# Patient Record
Sex: Female | Born: 1980 | Hispanic: No | Marital: Single | Smoking: Never smoker
Health system: Southern US, Community
[De-identification: ages and names within clinical notes are randomized; demographics above are authoritative.]

## PROBLEM LIST (undated history)

## (undated) DIAGNOSIS — I10 Essential (primary) hypertension: Secondary | ICD-10-CM

## (undated) DIAGNOSIS — G43909 Migraine, unspecified, not intractable, without status migrainosus: Secondary | ICD-10-CM

## (undated) HISTORY — DX: Migraine, unspecified, not intractable, without status migrainosus: G43.909

---

## 2013-04-08 ENCOUNTER — Emergency Department (HOSPITAL_BASED_OUTPATIENT_CLINIC_OR_DEPARTMENT_OTHER): Payer: Self-pay

## 2013-04-08 ENCOUNTER — Encounter (HOSPITAL_BASED_OUTPATIENT_CLINIC_OR_DEPARTMENT_OTHER): Payer: Self-pay

## 2013-04-08 ENCOUNTER — Emergency Department (HOSPITAL_BASED_OUTPATIENT_CLINIC_OR_DEPARTMENT_OTHER)
Admission: EM | Admit: 2013-04-08 | Discharge: 2013-04-08 | Disposition: A | Discharge status: Home / Self Care | Payer: Self-pay | Attending: Emergency Medicine | Admitting: Emergency Medicine

## 2013-04-08 DIAGNOSIS — R42 Dizziness and giddiness: Secondary | ICD-10-CM | POA: Insufficient documentation

## 2013-04-08 DIAGNOSIS — I10 Essential (primary) hypertension: Secondary | ICD-10-CM | POA: Insufficient documentation

## 2013-04-08 DIAGNOSIS — S0990XA Unspecified injury of head, initial encounter: Secondary | ICD-10-CM | POA: Insufficient documentation

## 2013-04-08 DIAGNOSIS — S6990XA Unspecified injury of unspecified wrist, hand and finger(s), initial encounter: Secondary | ICD-10-CM | POA: Insufficient documentation

## 2013-04-08 DIAGNOSIS — M79642 Pain in left hand: Secondary | ICD-10-CM

## 2013-04-08 DIAGNOSIS — S20219A Contusion of unspecified front wall of thorax, initial encounter: Secondary | ICD-10-CM | POA: Insufficient documentation

## 2013-04-08 HISTORY — DX: Essential (primary) hypertension: I10

## 2013-04-08 MED ORDER — HYDROCODONE-ACETAMINOPHEN 5-325 MG PO TABS: 1.0000 | ORAL_TABLET | Freq: Four times a day (QID) | ORAL | Status: DC | PRN
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
## 2013-04-08 NOTE — ED Notes (Signed)
Pt is unsure of plan of care after d/c.  [facility] [employer] of pt condition and pt is now speaking with staff regarding a plan of care.

## 2013-04-08 NOTE — ED Notes (Signed)
Pt states she is awaiting return call from [facility] regarding assistance.

## 2013-04-08 NOTE — ED Notes (Signed)
Communications informed that pt has been d/c'd home.

## 2013-04-08 NOTE — ED Notes (Signed)
[employer] called for pt transport.  Voucher provided.
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
## 2013-04-08 NOTE — ED Notes (Signed)
Spoke with [employer] regarding transport to patient's home.  [employer] unable to transport pt and baby.  Plan to call cab for transport to patient's home and [employer] to meet pt.

## 2013-09-28 ENCOUNTER — Ambulatory Visit: Payer: 59 | Attending: Physical Medicine & Rehabilitation | Admitting: Physical Therapy

## 2013-09-28 DIAGNOSIS — IMO0001 Reserved for inherently not codable concepts without codable children: Secondary | ICD-10-CM | POA: Diagnosis not present

## 2013-09-28 DIAGNOSIS — R609 Edema, unspecified: Secondary | ICD-10-CM | POA: Insufficient documentation

## 2013-09-28 DIAGNOSIS — M25569 Pain in unspecified knee: Secondary | ICD-10-CM | POA: Insufficient documentation

## 2013-09-28 DIAGNOSIS — M6281 Muscle weakness (generalized): Secondary | ICD-10-CM | POA: Diagnosis not present

## 2013-10-05 ENCOUNTER — Encounter: Payer: PRIVATE HEALTH INSURANCE | Admitting: Physical Therapy

## 2013-10-05 ENCOUNTER — Ambulatory Visit: Payer: 59 | Attending: Emergency Medicine | Admitting: Physical Therapy

## 2013-10-05 DIAGNOSIS — IMO0001 Reserved for inherently not codable concepts without codable children: Secondary | ICD-10-CM | POA: Diagnosis not present

## 2013-10-05 DIAGNOSIS — M25569 Pain in unspecified knee: Secondary | ICD-10-CM | POA: Diagnosis not present

## 2013-10-05 DIAGNOSIS — R609 Edema, unspecified: Secondary | ICD-10-CM | POA: Diagnosis not present

## 2013-10-05 DIAGNOSIS — M6281 Muscle weakness (generalized): Secondary | ICD-10-CM | POA: Insufficient documentation

## 2013-10-12 ENCOUNTER — Ambulatory Visit: Payer: 59 | Admitting: Physical Therapy

## 2013-10-12 DIAGNOSIS — IMO0001 Reserved for inherently not codable concepts without codable children: Secondary | ICD-10-CM | POA: Diagnosis not present

## 2013-10-19 ENCOUNTER — Ambulatory Visit: Payer: 59 | Admitting: Physical Therapy

## 2013-10-19 DIAGNOSIS — IMO0001 Reserved for inherently not codable concepts without codable children: Secondary | ICD-10-CM | POA: Diagnosis not present

## 2013-10-26 ENCOUNTER — Ambulatory Visit: Payer: 59 | Admitting: Physical Therapy

## 2013-10-26 DIAGNOSIS — IMO0001 Reserved for inherently not codable concepts without codable children: Secondary | ICD-10-CM | POA: Diagnosis not present

## 2013-11-02 ENCOUNTER — Ambulatory Visit: Payer: PRIVATE HEALTH INSURANCE | Attending: Orthopedic Surgery | Admitting: Physical Therapy

## 2013-11-02 ENCOUNTER — Encounter: Payer: PRIVATE HEALTH INSURANCE | Admitting: Physical Therapy

## 2013-11-02 DIAGNOSIS — R609 Edema, unspecified: Secondary | ICD-10-CM | POA: Insufficient documentation

## 2013-11-02 DIAGNOSIS — IMO0001 Reserved for inherently not codable concepts without codable children: Secondary | ICD-10-CM | POA: Insufficient documentation

## 2013-11-02 DIAGNOSIS — M25569 Pain in unspecified knee: Secondary | ICD-10-CM | POA: Insufficient documentation

## 2013-11-02 DIAGNOSIS — M6281 Muscle weakness (generalized): Secondary | ICD-10-CM | POA: Insufficient documentation

## 2013-11-09 ENCOUNTER — Ambulatory Visit: Payer: PRIVATE HEALTH INSURANCE | Admitting: Physical Therapy

## 2013-11-16 ENCOUNTER — Encounter: Payer: PRIVATE HEALTH INSURANCE | Admitting: Physical Therapy

## 2013-11-23 ENCOUNTER — Encounter: Payer: PRIVATE HEALTH INSURANCE | Admitting: Physical Therapy

## 2013-12-07 ENCOUNTER — Ambulatory Visit: Payer: PRIVATE HEALTH INSURANCE | Attending: Emergency Medicine | Admitting: Physical Therapy

## 2013-12-07 DIAGNOSIS — M6281 Muscle weakness (generalized): Secondary | ICD-10-CM | POA: Insufficient documentation

## 2013-12-07 DIAGNOSIS — R609 Edema, unspecified: Secondary | ICD-10-CM | POA: Insufficient documentation

## 2013-12-07 DIAGNOSIS — M25569 Pain in unspecified knee: Secondary | ICD-10-CM | POA: Insufficient documentation

## 2013-12-07 DIAGNOSIS — IMO0001 Reserved for inherently not codable concepts without codable children: Secondary | ICD-10-CM | POA: Insufficient documentation

## 2013-12-14 ENCOUNTER — Ambulatory Visit: Payer: PRIVATE HEALTH INSURANCE | Admitting: Physical Therapy

## 2013-12-21 ENCOUNTER — Ambulatory Visit: Payer: PRIVATE HEALTH INSURANCE | Admitting: Physical Therapy

## 2013-12-28 ENCOUNTER — Ambulatory Visit: Payer: PRIVATE HEALTH INSURANCE | Attending: Orthopedic Surgery | Admitting: Physical Therapy

## 2013-12-28 DIAGNOSIS — R609 Edema, unspecified: Secondary | ICD-10-CM | POA: Insufficient documentation

## 2013-12-28 DIAGNOSIS — M25569 Pain in unspecified knee: Secondary | ICD-10-CM | POA: Insufficient documentation

## 2013-12-28 DIAGNOSIS — IMO0001 Reserved for inherently not codable concepts without codable children: Secondary | ICD-10-CM | POA: Insufficient documentation

## 2013-12-28 DIAGNOSIS — M6281 Muscle weakness (generalized): Secondary | ICD-10-CM | POA: Insufficient documentation
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
## 2014-01-02 ENCOUNTER — Ambulatory Visit: Payer: PRIVATE HEALTH INSURANCE | Admitting: Physical Therapy

## 2014-01-04 ENCOUNTER — Encounter: Payer: PRIVATE HEALTH INSURANCE | Admitting: Physical Therapy

## 2014-01-09 ENCOUNTER — Encounter: Payer: 59 | Admitting: Physical Therapy

## 2014-01-09 ENCOUNTER — Encounter: Payer: PRIVATE HEALTH INSURANCE | Admitting: Physical Therapy

## 2014-01-16 ENCOUNTER — Encounter: Payer: PRIVATE HEALTH INSURANCE | Admitting: Physical Therapy

## 2014-01-18 ENCOUNTER — Encounter: Payer: Self-pay | Admitting: Emergency Medicine

## 2014-01-18 ENCOUNTER — Ambulatory Visit (INDEPENDENT_AMBULATORY_CARE_PROVIDER_SITE_OTHER): Payer: PRIVATE HEALTH INSURANCE | Admitting: Emergency Medicine

## 2014-01-18 VITALS — BP 124/84 | Ht 64.0 in | Wt 192.0 lb

## 2014-01-18 DIAGNOSIS — M2140 Flat foot [pes planus] (acquired), unspecified foot: Secondary | ICD-10-CM

## 2014-01-18 DIAGNOSIS — M216X2 Other acquired deformities of left foot: Principal | ICD-10-CM

## 2014-01-18 DIAGNOSIS — M216X9 Other acquired deformities of unspecified foot: Secondary | ICD-10-CM

## 2014-01-18 DIAGNOSIS — M216X1 Other acquired deformities of right foot: Secondary | ICD-10-CM

## 2014-01-18 NOTE — Assessment & Plan Note (Signed)
Green soles insoles with scaphoid pads were applied today to her shoes. I also recommended that she consider a more supportive sandal shoe or sandal is a fair amount of time. She will wear these temporary insoles and if they seem to alleviate her symptoms and help with both her knee and lateral hip pain she will consider returning for custom orthotics.

## 2014-01-18 NOTE — Progress Notes (Signed)
Patient ID: Cynthia LabsSameena Carey, female   DOB: 1981/05/09, 33 y.o.   MRN: [id number] 33 year old female presents for evaluation of need for arch supports. She is currently going to physical therapy for knee pain as well as left hip pain. She's been noted to have a significant amount of arch collapse which seems to exacerbate her symptoms when she is walking for extended periods.  Pertinent past medical history: Left hip/IT band syndrome, left knee pain/patellofemoral syndrome.  Social history: Nonsmoker, no alcohol  Review of systems as per history of present illness otherwise all systems negative present.  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

## 2014-01-18 NOTE — Patient Instructions (Signed)
There are numerous sandals available that have good supportive bases.  Companies like [employer] make sandals that will provide good support for your feet when you wear sandals.  If you feel comfortable in the inserts we made today and they help you may consider custom orthotics that will mold to your feet and provide excellent long term support.

## 2014-01-23 ENCOUNTER — Encounter: Payer: PRIVATE HEALTH INSURANCE | Admitting: Physical Therapy

## 2014-02-13 ENCOUNTER — Encounter: Payer: Self-pay | Admitting: Cardiology

## 2014-02-13 ENCOUNTER — Ambulatory Visit (INDEPENDENT_AMBULATORY_CARE_PROVIDER_SITE_OTHER): Payer: PRIVATE HEALTH INSURANCE | Admitting: Cardiology

## 2014-02-13 VITALS — BP 132/76 | HR 103 | Ht 64.0 in | Wt 195.0 lb

## 2014-02-13 DIAGNOSIS — G43909 Migraine, unspecified, not intractable, without status migrainosus: Secondary | ICD-10-CM | POA: Insufficient documentation

## 2014-02-13 DIAGNOSIS — I10 Essential (primary) hypertension: Secondary | ICD-10-CM

## 2014-02-13 DIAGNOSIS — R002 Palpitations: Secondary | ICD-10-CM

## 2014-02-13 DIAGNOSIS — R0789 Other chest pain: Secondary | ICD-10-CM
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
## 2014-02-13 NOTE — Patient Instructions (Signed)
Your physician recommends that you continue on your current medications as directed. Please refer to the Current Medication list given to you today.  Your physician has requested that you have an exercise tolerance test. For further information please visit [email or web address]. Please also follow instruction sheet, as given.  Your physician has recommended that you wear a holter monitor. Holter monitors are medical devices that record the heart's electrical activity. Doctors most often use these monitors to diagnose arrhythmias. Arrhythmias are problems with the speed or rhythm of the heartbeat. The monitor is a small, portable device. You can wear one while you do your normal daily activities. This is usually used to diagnose what is causing palpitations/syncope (passing out).   Your physician has requested that you regularly monitor and record your blood pressure readings at home. Please use the same machine at the same time of day to check your readings and record them for one week and call us with the results.  Your physician recommends that you schedule a follow-up appointment As Needed with Dr Mayford Knifeurner

## 2014-03-01 ENCOUNTER — Ambulatory Visit: Payer: PRIVATE HEALTH INSURANCE | Admitting: Emergency Medicine

## 2014-03-02 ENCOUNTER — Ambulatory Visit: Payer: PRIVATE HEALTH INSURANCE | Admitting: Internal Medicine

## 2014-03-16 ENCOUNTER — Encounter (HOSPITAL_COMMUNITY): Payer: Self-pay | Admitting: *Deleted

## 2014-03-23 ENCOUNTER — Encounter (HOSPITAL_COMMUNITY): Payer: PRIVATE HEALTH INSURANCE

## 2014-04-14 ENCOUNTER — Telehealth (HOSPITAL_COMMUNITY): Payer: Self-pay

## 2014-04-14 NOTE — Telephone Encounter (Signed)
Encounter complete. 

## 2014-04-19 ENCOUNTER — Encounter (HOSPITAL_COMMUNITY): Payer: PRIVATE HEALTH INSURANCE

## 2014-04-20 ENCOUNTER — Telehealth (HOSPITAL_COMMUNITY): Payer: Self-pay

## 2014-04-20 NOTE — Telephone Encounter (Signed)
Encounter complete. 

## 2014-04-25 ENCOUNTER — Ambulatory Visit (HOSPITAL_COMMUNITY)
Admission: RE | Admit: 2014-04-25 | Discharge: 2014-04-25 | Disposition: A | Discharge status: Home / Self Care | Payer: 59 | Source: Ambulatory Visit | Attending: Cardiovascular Disease | Admitting: Cardiovascular Disease

## 2014-04-25 DIAGNOSIS — I498 Other specified cardiac arrhythmias: Secondary | ICD-10-CM | POA: Diagnosis present

## 2014-04-25 DIAGNOSIS — R0789 Other chest pain: Secondary | ICD-10-CM

## 2014-04-25 NOTE — Procedures (Signed)
Exercise Treadmill Test  Pre-Exercise Testing Evaluation Mild sinus tachycardia, 104 bpm  Test  Exercise Tolerance Test Ordering MD: Armanda Magicraci Turner    Unique Test No: 1   Treadmill:  1  Indication for ETT: palpitations  Contraindication to ETT: No   Stress Modality: exercise - treadmill  Cardiac Imaging Performed: non   Protocol: standard Bruce - maximal  Max BP:  149/86  Max MPHR (bpm):  186 85% MPR (bpm):  158  MPHR obtained (bpm):  187 % MPHR obtained:  100  Reached 85% MPHR (min:sec):  4:10 Total Exercise Time (min-sec):  7  Workload in METS:  8.5 Borg Scale: 16  Reason ETT Terminated:  dyspnea    ST Segment Analysis At Rest: normal ST segments - no evidence of significant ST depression With Exercise: no evidence of significant ST depression  Other Information Arrhythmia:  No Angina during ETT:  absent (0) Quality of ETT:  diagnostic  ETT Interpretation:  normal - no evidence of ischemia by ST analysis  Comments: Baseline sinus tachycardia  Thurmon FairMihai Advaith Lamarque, MD, [facility] [phone] office [phone] pager

## 2014-04-27 ENCOUNTER — Encounter (INDEPENDENT_AMBULATORY_CARE_PROVIDER_SITE_OTHER): Payer: PRIVATE HEALTH INSURANCE

## 2014-04-27 ENCOUNTER — Encounter: Payer: Self-pay | Admitting: *Deleted

## 2014-04-27 DIAGNOSIS — R002 Palpitations: Secondary | ICD-10-CM

## 2014-04-27 NOTE — Progress Notes (Signed)
Patient ID: Cynthia Carey, female   DOB: 1981/02/22, 33 y.o.   MRN: [id number] E-Cardio 24 hour holter monitor applied to patient.

## 2014-04-28 ENCOUNTER — Ambulatory Visit (INDEPENDENT_AMBULATORY_CARE_PROVIDER_SITE_OTHER): Payer: PRIVATE HEALTH INSURANCE | Admitting: Sports Medicine

## 2014-04-28 ENCOUNTER — Encounter: Payer: Self-pay | Admitting: Sports Medicine

## 2014-04-28 VITALS — BP 112/75 | HR 102 | Ht 64.0 in | Wt 195.0 lb

## 2014-04-28 DIAGNOSIS — M216X9 Other acquired deformities of unspecified foot: Secondary | ICD-10-CM

## 2014-04-28 DIAGNOSIS — M216X2 Other acquired deformities of left foot: Principal | ICD-10-CM

## 2014-04-28 DIAGNOSIS — M216X1 Other acquired deformities of right foot: Secondary | ICD-10-CM

## 2014-04-28 NOTE — Progress Notes (Signed)
Patient ID: Cynthia Carey, female   DOB: 29-Mar-1981, 33 y.o.   MRN: [id number]  Patient comes in today to consider custom orthotics. She was recently fitted with a pair of green sports insoles and scaphoid pads. She has found them to be comfortable. She would like to now go ahead and schedule an appointment for custom orthotics to be made. Unfortunately, due to time constraints, we were unable to construct those today. We will schedule her to return to the office on 05/02/2014 at 2:30.

## 2014-05-02 ENCOUNTER — Ambulatory Visit: Payer: PRIVATE HEALTH INSURANCE | Admitting: Sports Medicine

## 2014-05-08 ENCOUNTER — Telehealth: Payer: Self-pay | Admitting: Cardiology

## 2014-05-08 NOTE — Telephone Encounter (Signed)
Please let patient know that heart monitor showed NSR with sinus tachycardia up to 161 bpm.  Her average HR is 108bpm.  Please start Atenolol 12.5mg  daily and have her followup with nurse BP/HR check in 1 week.  Please have her check a TSH

## 2014-05-09 ENCOUNTER — Encounter: Payer: Self-pay | Admitting: Sports Medicine

## 2014-05-09 ENCOUNTER — Ambulatory Visit (INDEPENDENT_AMBULATORY_CARE_PROVIDER_SITE_OTHER): Payer: PRIVATE HEALTH INSURANCE | Admitting: Sports Medicine

## 2014-05-09 VITALS — BP 127/86 | HR 102 | Ht 64.0 in | Wt 195.0 lb

## 2014-05-09 DIAGNOSIS — M216X2 Other acquired deformities of left foot: Principal | ICD-10-CM

## 2014-05-09 DIAGNOSIS — M216X9 Other acquired deformities of unspecified foot: Secondary | ICD-10-CM

## 2014-05-09 DIAGNOSIS — M216X1 Other acquired deformities of right foot: Secondary | ICD-10-CM

## 2014-05-09 NOTE — Progress Notes (Signed)
Subjective: The patient presents today for custom orthotics for bilateral pes planus.  Please see the prior notes for history pertinent to this problem.   Objective: Examination of the bilateral feet reveals pes planus deformity.  Assessment: Bilateral pes planus   Procedure: Patient was fitted for a : standard, cushioned, semi-rigid orthotic.  The orthotic was heated and afterward the patient stood on the orthotic blank positioned on the orthotic stand.  The patient was positioned in subtalar neutral position and 10 degrees of ankle dorsiflexion in a weight bearing stance.  After completion of molding, a stable base was applied to the orthotic blank.  The blank was ground to a stable position for weight bearing.  Size: 7 Base: Blue EVA  Posting: None  Additional orthotic padding: None   Plan: Patient may return to clinic if she has issues with the orthotics. We discussed that she may take 1-2 days to adjust to the orthotics, but if she has any significant increase in pain, then she should call for orthotic adjustment. All questions were answered.   Time spent with visit 30 minutes, greater than 50% was face-to-face time counseling, constructing, and fitting the orthotics.

## 2014-05-09 NOTE — Assessment & Plan Note (Signed)
Patient may return to clinic if she has issues with the orthotics. We discussed that she may take 1-2 days to adjust to the orthotics, but if she has any significant increase in pain, then she should call for orthotic adjustment. All questions were answered.  Time spent with visit 30 minutes, greater than 50% was face-to-face time counseling, constructing, and fitting the orthotics.

## 2014-05-10 ENCOUNTER — Other Ambulatory Visit: Payer: Self-pay | Admitting: General Surgery

## 2014-05-10 ENCOUNTER — Telehealth: Payer: Self-pay | Admitting: Cardiology

## 2014-05-10 DIAGNOSIS — R000 Tachycardia, unspecified: Secondary | ICD-10-CM

## 2014-05-10 MED ORDER — ATENOLOL 25 MG PO TABS: 12.5000 mg | ORAL_TABLET | Freq: Every day | ORAL | Status: DC

## 2014-05-10 NOTE — Telephone Encounter (Signed)
lmtrc

## 2014-05-10 NOTE — Telephone Encounter (Signed)
Spoke with pt and New rx sent in for pt.

## 2014-05-10 NOTE — Telephone Encounter (Signed)
New message     Pt is returning a nurses call from a few days ago

## 2014-05-10 NOTE — Telephone Encounter (Signed)
Spoke with pt and made aware New rx sent in for pt. Will have pt come in on my day of F/u next Friday Aug 21st for BP check since nurse rooms are closed and Dr Mayford Knifeurner wants this done. Will ask linda to put on schedule and lab schedule.

## 2014-05-10 NOTE — Telephone Encounter (Signed)
Spoke with pt and gave appt time

## 2014-05-10 NOTE — Telephone Encounter (Signed)
New message ° ° ° ° ° ° ° ° ° °Pt returning nurses call °

## 2014-05-19 ENCOUNTER — Other Ambulatory Visit: Payer: PRIVATE HEALTH INSURANCE

## 2014-06-09 ENCOUNTER — Telehealth: Payer: Self-pay | Admitting: Cardiology

## 2014-06-09 NOTE — Telephone Encounter (Signed)
TO Dr Turner to advise.  

## 2014-06-09 NOTE — Telephone Encounter (Signed)
Please find out if she has ever been on Bystolic

## 2014-06-09 NOTE — Telephone Encounter (Signed)
New Message  Pt called states that she is experiencing symptoms with Atenolol. Pt states that she is feeling drowsy and her throat is dry and often feels as if she is choking.

## 2014-06-12 NOTE — Telephone Encounter (Signed)
Pt has appt to see you tomorrow at 10:45 to discuss more.

## 2014-06-12 NOTE — Telephone Encounter (Signed)
Pt had been on Bystolic in the past and it caused the same problems as the atenolol. She stopped atenolol after only two days. Her problems stopped after taking the medication. Pt felt her BP was to low on atenolol example 110/70 and her HR was still 100-105. Pt does not feel any issues with her Tachycardia. She states that her HR is mostly still over 100. Pt does not want to continue with a beta blocker.

## 2014-06-13 ENCOUNTER — Encounter: Payer: Self-pay | Admitting: General Surgery

## 2014-06-13 ENCOUNTER — Encounter: Payer: Self-pay | Admitting: Cardiology

## 2014-06-13 ENCOUNTER — Ambulatory Visit (INDEPENDENT_AMBULATORY_CARE_PROVIDER_SITE_OTHER): Payer: 59 | Admitting: Cardiology

## 2014-06-13 ENCOUNTER — Other Ambulatory Visit (INDEPENDENT_AMBULATORY_CARE_PROVIDER_SITE_OTHER): Payer: 59

## 2014-06-13 VITALS — BP 110/77 | HR 93 | Ht 64.0 in | Wt 199.0 lb

## 2014-06-13 DIAGNOSIS — R000 Tachycardia, unspecified: Secondary | ICD-10-CM

## 2014-06-13 DIAGNOSIS — I498 Other specified cardiac arrhythmias: Secondary | ICD-10-CM

## 2014-06-13 DIAGNOSIS — I10 Essential (primary) hypertension: Secondary | ICD-10-CM

## 2014-06-13 DIAGNOSIS — R002 Palpitations: Secondary | ICD-10-CM

## 2014-06-13 LAB — TSH: TSH: 1.72 u[IU]/mL (ref 0.35–4.50)
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
## 2014-06-13 NOTE — Patient Instructions (Signed)
Your physician recommends that you schedule a follow-up appointment in: 4 months with Dr Mayford Knife

## 2014-06-14 ENCOUNTER — Other Ambulatory Visit: Payer: PRIVATE HEALTH INSURANCE

## 2014-06-20 ENCOUNTER — Ambulatory Visit: Payer: No Typology Code available for payment source | Attending: Orthopedic Surgery | Admitting: Occupational Therapy

## 2014-06-20 DIAGNOSIS — M2550 Pain in unspecified joint: Secondary | ICD-10-CM | POA: Diagnosis not present

## 2014-06-20 DIAGNOSIS — IMO0001 Reserved for inherently not codable concepts without codable children: Secondary | ICD-10-CM | POA: Insufficient documentation

## 2014-06-20 DIAGNOSIS — M6281 Muscle weakness (generalized): Secondary | ICD-10-CM | POA: Insufficient documentation

## 2014-06-22 ENCOUNTER — Encounter: Payer: 59 | Admitting: Occupational Therapy

## 2014-06-23 ENCOUNTER — Ambulatory Visit: Payer: No Typology Code available for payment source | Admitting: Occupational Therapy

## 2014-06-23 DIAGNOSIS — IMO0001 Reserved for inherently not codable concepts without codable children: Secondary | ICD-10-CM | POA: Diagnosis not present

## 2014-06-27 ENCOUNTER — Ambulatory Visit: Payer: No Typology Code available for payment source | Admitting: Occupational Therapy

## 2014-06-27 DIAGNOSIS — IMO0001 Reserved for inherently not codable concepts without codable children: Secondary | ICD-10-CM | POA: Diagnosis not present

## 2014-06-29 ENCOUNTER — Ambulatory Visit: Payer: No Typology Code available for payment source | Attending: Orthopedic Surgery | Admitting: Occupational Therapy

## 2014-06-29 DIAGNOSIS — M2550 Pain in unspecified joint: Secondary | ICD-10-CM | POA: Insufficient documentation

## 2014-06-29 DIAGNOSIS — M654 Radial styloid tenosynovitis [de Quervain]: Secondary | ICD-10-CM | POA: Insufficient documentation

## 2014-06-29 DIAGNOSIS — M6281 Muscle weakness (generalized): Secondary | ICD-10-CM | POA: Insufficient documentation

## 2014-06-29 DIAGNOSIS — M25531 Pain in right wrist: Secondary | ICD-10-CM | POA: Diagnosis present

## 2014-06-30 ENCOUNTER — Encounter: Payer: 59 | Admitting: Occupational Therapy

## 2014-07-05 ENCOUNTER — Encounter: Payer: 59 | Admitting: Occupational Therapy

## 2014-07-07 ENCOUNTER — Ambulatory Visit: Payer: No Typology Code available for payment source | Admitting: Occupational Therapy

## 2014-07-07 DIAGNOSIS — M25531 Pain in right wrist: Secondary | ICD-10-CM | POA: Diagnosis not present

## 2014-07-11 ENCOUNTER — Ambulatory Visit: Payer: No Typology Code available for payment source | Admitting: Occupational Therapy

## 2014-07-11 DIAGNOSIS — M25531 Pain in right wrist: Secondary | ICD-10-CM | POA: Diagnosis not present

## 2014-07-13 ENCOUNTER — Encounter: Payer: 59 | Admitting: Occupational Therapy

## 2014-07-14 ENCOUNTER — Ambulatory Visit: Payer: No Typology Code available for payment source | Admitting: Occupational Therapy

## 2014-07-14 DIAGNOSIS — M25531 Pain in right wrist: Secondary | ICD-10-CM | POA: Diagnosis not present

## 2014-07-19 ENCOUNTER — Ambulatory Visit: Payer: No Typology Code available for payment source | Admitting: Occupational Therapy

## 2014-07-19 DIAGNOSIS — M25531 Pain in right wrist: Secondary | ICD-10-CM | POA: Diagnosis not present

## 2014-07-21 ENCOUNTER — Ambulatory Visit: Payer: No Typology Code available for payment source | Admitting: Occupational Therapy

## 2014-07-21 DIAGNOSIS — M25531 Pain in right wrist: Secondary | ICD-10-CM | POA: Diagnosis not present

## 2014-07-25 ENCOUNTER — Ambulatory Visit: Payer: No Typology Code available for payment source | Admitting: Occupational Therapy

## 2014-07-25 DIAGNOSIS — M25531 Pain in right wrist: Secondary | ICD-10-CM | POA: Diagnosis not present

## 2014-07-27 ENCOUNTER — Ambulatory Visit: Payer: No Typology Code available for payment source | Admitting: Occupational Therapy

## 2014-07-31 ENCOUNTER — Ambulatory Visit: Payer: No Typology Code available for payment source | Attending: Orthopedic Surgery | Admitting: Occupational Therapy

## 2014-07-31 ENCOUNTER — Encounter: Payer: Self-pay | Admitting: Occupational Therapy

## 2014-07-31 DIAGNOSIS — M25531 Pain in right wrist: Secondary | ICD-10-CM | POA: Diagnosis not present

## 2014-07-31 DIAGNOSIS — M6281 Muscle weakness (generalized): Secondary | ICD-10-CM | POA: Insufficient documentation

## 2014-07-31 DIAGNOSIS — M2550 Pain in unspecified joint: Secondary | ICD-10-CM | POA: Insufficient documentation

## 2014-07-31 DIAGNOSIS — M654 Radial styloid tenosynovitis [de Quervain]: Secondary | ICD-10-CM | POA: Diagnosis not present

## 2014-07-31 NOTE — Therapy (Addendum)
Occupational Therapy Treatment  Patient Details  Name: Cynthia FeltSameena Gent MRN: [id number] Date of Birth: [birth year]  Encounter Date: 07/31/2014      OT End of Session - 07/31/14 1615    Visit Number 11   Number of Visits 17   Date for OT Re-Evaluation 08/18/14   OT Start Time 0248   OT Stop Time 0345   OT Time Calculation (min) 57 min   Activity Tolerance Patient tolerated treatment well    *OT treatment provided from 1448-1545 on 07/31/14.  Past Medical History  Diagnosis Date  . Hypertension     during pregnancy  . Migraine     History reviewed. No pertinent past surgical history.  There were no vitals taken for this visit.  Visit Diagnosis:  Right wrist pain  Hand muscle weakness   Subjective:  Pt reports pain improved overall, but lifted suitcases since last visit and pain is worse since lifting it.  Pt reports that she hasn't done HEP last few days due to 6/10 pain.  Pt reports that she saw MD 07/25/14 and that he recommended strengthening.  Pain:  Pt reports 5/10 pain in R hand with rest alleviating and lifting aggravating.       OT Treatments/Exercises (OP) - 07/31/14 0700    Ultrasound Location R dorsal/ulnar wrist   Ultrasound Parameters 3.23mhz, 20% pulsed, 0.8wts/cm2, x768min   Ultrasound Goals Pain  no adverse reactions   Type of Iontophoresis Dexamethasone   Location R volar/radial wrist   Dose 1.5cc, intensity=2.1MA   Time 19min with no adverse reactions, 3 small blisters and mild redness noted at medication electrode site     Attempted use of reacher for simulated laundry task with splint; however, pt reported increased pain so discontinued.  Recommended pt use L hand to remove laundry from washer/put clothes in dryer, wear splint, and only pull one item out at a time.  Pt verbalized understanding.  Pt instructed in Ionto precautions and given handout.  Pt verbalized understanding.  Pt instructed that progressive strengthening would likely increase pain  at this time since pt still reports moderate pain and has not been able to do isometric HEP due to pain the last few days.          Education - 07/31/14 1526    Education provided Yes   Education Details increase wear time of splint (most of the time during the day) and don't fight against splint   Education Details Patient   Methods Explanation   Comprehension Verbalized understanding            OT Long Term Goals - 07/31/14 1528    Title I with updated HEP   Time 3   Period Weeks   Status On-going   Title Pt will report that she has resumed use of RUE as dominant hand with pain less than or equal to 3/10.   Time 3   Status On-going   Title Pt will demonstrate grip strength of 40lbs or greater in RUE for increased functional use.   Time 3   Period Weeks   Status On-going          Plan - 07/31/14 1527    Clinical Impression Statement pt reports pain decreased overall, but flare up after lifting without splint on   OT Plan Iont to R volar/radial wrist for 2 more sessions max        Problem List Patient Active Problem List   Diagnosis Date Noted  . Sinus tachycardia  06/13/2014  . Heart palpitations 02/13/2014  . Hypertension   . Migraine   . 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 Collapsed arches 01/18/2014                                            Cayce Quezada 07/31/2014, 4:21 PM

## 2014-07-31 NOTE — Patient Instructions (Addendum)
Please try to wear your splint most of the time during the day, particularly during aggravating activities/lifting.  Don't fight against the splint when you are using your hand.

## 2014-08-01 NOTE — Therapy (Deleted)
Occupational Therapy Treatment  Patient Details  Name: Cynthia Carey MRN: [id number] Date of Birth: Apr 30, 1981  Encounter Date: 07/31/2014      OT End of Session - 07/31/14 1615    Visit Number 11   Number of Visits 17   Date for OT Re-Evaluation 08/18/14   OT Start Time 0248   OT Stop Time 0345   OT Time Calculation (min) 57 min   Activity Tolerance Patient tolerated treatment well      Past Medical History  Diagnosis Date  . Hypertension     during pregnancy  . Migraine     History reviewed. No pertinent past surgical history.  There were no vitals taken for this visit.  Visit Diagnosis:  Right wrist pain  Hand muscle weakness          OT Treatments/Exercises (OP) - 07/31/14 0700    Ultrasound Location R dorsal/ulnar wrist   Ultrasound Parameters 3.223mhz, 20% pulsed, 0.8wts/cm2, x298min   Ultrasound Goals Pain  no adverse reactions   Type of Iontophoresis Dexamethasone   Location R volar/radial wrist   Dose 1.5cc, intensity=2.1MA   Time 19min with no adverse reactions, 3 small blisters and mild redness noted at medication electrode site          Education - 07/31/14 1526    Education provided Yes   Education Details increase wear time of splint (most of the time during the day) and don't fight against splint   Education Details Patient   Methods Explanation   Comprehension Verbalized understanding            OT Long Term Goals - 07/31/14 1528    Title I with updated HEP   Time 3   Period Weeks   Status On-going   Title Pt will report that she has resumed use of RUE as dominant hand with pain less than or equal to 3/10.   Time 3   Status On-going   Title Pt will demonstrate grip strength of 40lbs or greater in RUE for increased functional use.   Time 3   Period Weeks   Status On-going          Plan - 07/31/14 1527    Clinical Impression Statement pt reports pain decreased overall, but flare up after lifting without splint on   OT  Plan Iont to R volar/radial wrist for 2 more sessions max        Problem List Patient Active Problem List   Diagnosis Date Noted  . Sinus tachycardia 06/13/2014  . Heart palpitations 02/13/2014  . Hypertension   . Migraine   . 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 Collapsed arches 01/18/2014                                            FREEMAN,ANGELA 08/01/2014, 8:55 AM

## 2014-08-02 ENCOUNTER — Ambulatory Visit: Payer: No Typology Code available for payment source | Admitting: Occupational Therapy

## 2014-08-02 ENCOUNTER — Encounter: Payer: Self-pay | Admitting: Occupational Therapy

## 2014-08-02 DIAGNOSIS — M25531 Pain in right wrist: Secondary | ICD-10-CM | POA: Diagnosis not present

## 2014-08-02 DIAGNOSIS — M6281 Muscle weakness (generalized): Secondary | ICD-10-CM

## 2014-08-02 NOTE — Therapy (Signed)
Occupational Therapy Treatment  Patient Details  Name: Cynthia FeltSameena Carey MRN: [id number] Date of Birth: 21-Feb-1981  Encounter Date: 08/02/2014      OT End of Session - 08/02/14 1443    Visit Number 12   Number of Visits 17   Date for OT Re-Evaluation 08/18/14   OT Start Time 1405   OT Stop Time 1455   OT Time Calculation (min) 50 min   Activity Tolerance Patient tolerated treatment well      Past Medical History  Diagnosis Date  . Hypertension     during pregnancy  . Migraine     History reviewed. No pertinent past surgical history.  There were no vitals taken for this visit.  Visit Diagnosis:  Right wrist pain  Hand muscle weakness          OT Treatments/Exercises (OP) - 08/02/14 0700    Ultrasound   Ultrasound Location Rt dorsal/ulnar wrist   Ultrasound Parameters 3.3 mhz 20% pulsed, 0.8 wts/cm2 x 8 min   Ultrasound Goals Pain   Iontophoresis   Type of Iontophoresis Dexamethasone   Location Rt volar/radial wrist   Dose 1.5 cc, intensity = 2.1 mA   Time 19 min    Pt asked questions re: progression of strengthening and continuation of therapy. Explained that progressing strengthening is not indicated when pain is flaring up with any resistive exercise. Recommended pt continue isometric exercises to tolerance and below theraputty ex's. Anticipate d/c in the next 2 weeks due to lack of progress.      Education - 08/02/14 1442    Education provided Yes   Education Details Theraputty exercises for mass grasp. Pt instructed to perform 10 reps, 3x/day keeping wrist neutral   Education Details Patient   Methods Explanation;Demonstration   Comprehension Verbalized understanding;Returned demonstration              Plan - 08/02/14 1445    Clinical Impression Statement Pt reports pain better today.   OT Plan Ionto to Rt volar/radial wrist 1 more session, then progress strengthening as tolerated if pain allows        Problem List Patient Active Problem  List   Diagnosis Date Noted  . Sinus tachycardia 06/13/2014  . Heart palpitations 02/13/2014  . Hypertension   . Migraine   . 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 Collapsed arches 01/18/2014                                            Kelli ChurnBallie, Rogina Schiano Johnson 08/02/2014, 2:49 PM

## 2014-08-07 ENCOUNTER — Encounter: Payer: Self-pay | Admitting: Occupational Therapy

## 2014-08-07 ENCOUNTER — Ambulatory Visit: Payer: No Typology Code available for payment source | Admitting: Occupational Therapy

## 2014-08-07 DIAGNOSIS — M25531 Pain in right wrist: Secondary | ICD-10-CM

## 2014-08-07 DIAGNOSIS — M6281 Muscle weakness (generalized): Secondary | ICD-10-CM

## 2014-08-07 NOTE — Therapy (Signed)
Occupational Therapy Treatment  Patient Details  Name: Cynthia FeltSameena Carey MRN: [id number] Date of Birth: 1980-10-16  Encounter Date: 08/07/2014      OT End of Session - 08/07/14 1225    Visit Number 13   Number of Visits 17   Date for OT Re-Evaluation 08/18/14   OT Start Time 1145   OT Stop Time 1233   OT Time Calculation (min) 48 min   Activity Tolerance Patient limited by pain;Patient tolerated treatment well      Past Medical History  Diagnosis Date  . Hypertension     during pregnancy  . Migraine     History reviewed. No pertinent past surgical history.  There were no vitals taken for this visit.  Visit Diagnosis:  Right wrist pain  Hand muscle weakness          OT Treatments/Exercises (OP) - 08/07/14 1215    ADLs   ADL Education Given Yes   Compensatory Strategies Pt given recommendations on how to adapt tasks to decrease pain including how to stir/mix, hold pots and drain pots for cooking. Pt also shown A/E options for opening jars/containers and how to adapt holding groceries and picking up son   Ultrasound   Ultrasound Location Rt radial wrist per pt request   Ultrasound Parameters 3.3 mhz, 20% pulsed, 0.8 wts/cm2 x 8 min   Ultrasound Goals Pain   Iontophoresis   Type of Iontophoresis Dexamethasone   Location Rt ulnar wrist per pt request   Dose 1.5 cc, 2.1 mA   Time 19 min          Education - 08/07/14 1225    Education provided No  see treatment note for ADL education              Plan - 08/07/14 1238    Clinical Impression Statement Pt fluctuating in pain locations from ulnar to radial side of Rt wrist. Pt also c/o new Lt wrist pain due to not using Rt hand. Pt limited in progress due to pain. Progressing towards all goals   OT Plan Pt completed with iontophoresis!! Progress w/ strengthening as able        Problem List Patient Active Problem List   Diagnosis Date Noted  . Sinus tachycardia 06/13/2014  . Heart palpitations  02/13/2014  . Hypertension   . Migraine   . 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 Collapsed arches 01/18/2014                                            Kelli ChurnBallie, Thurston Brendlinger Johnson 08/07/2014, 12:59 PM

## 2014-08-09 ENCOUNTER — Ambulatory Visit: Payer: No Typology Code available for payment source | Admitting: Occupational Therapy

## 2014-08-09 DIAGNOSIS — M6281 Muscle weakness (generalized): Secondary | ICD-10-CM

## 2014-08-09 DIAGNOSIS — M25531 Pain in right wrist: Secondary | ICD-10-CM | POA: Diagnosis not present

## 2014-08-09 NOTE — Patient Instructions (Addendum)
Home exercise program for strengthening:  Continue squeezing your yellow putty 20 reps 1-2 x day Roll out putty into a log, pinch putty with each finger and your thumb, 20 reps 1-2x day  Hold a 1 lb weight or water bottle bend wrist down and up 20 reps with palm down,  repeat with palm up 20 reps 1x day  Stop exercising if pain increases!  You may use a microwave hot pack if you have pain and stiffness, wrap in a towel.  Do not apply longer than 10 mins, monitor skin closely to avoid burning skin.

## 2014-08-09 NOTE — Therapy (Signed)
Occupational Therapy Treatment  Patient Details  Name: Cynthia Carey MRN: [id number] Date of Birth: June 01, 1981  Encounter Date: 08/09/2014      OT End of Session - 08/09/14 1458    Visit Number 14   Number of Visits 17   Date for OT Re-Evaluation 08/21/14   OT Start Time 0154   OT Stop Time 0250   OT Time Calculation (min) 56 min   Activity Tolerance Patient tolerated treatment well   Behavior During Therapy [facility] for tasks assessed/performed      Past Medical History  Diagnosis Date  . Hypertension     during pregnancy  . Migraine     No past surgical history on file.  There were no vitals taken for this visit.  Visit Diagnosis:  Right wrist pain  Hand muscle weakness      Subjective Assessment - 08/09/14 1358    Currently in Pain? Yes   Pain Score 2    Pain Location Hand   Pain Orientation Right   Pain Type Chronic pain   Pain Onset More than a month ago   Pain Frequency Intermittent   Aggravating Factors  overuse aggravates   Pain Relieving Factors splint, rest alleviates   Effect of Pain on Daily Activities mild limitation of daily activities   Multiple Pain Sites No              OT Education - 08/09/14 1457    Education provided Yes   Education Details updated HEP, putty, wrist strengthening   Person(s) Educated Patient   Methods Explanation;Demonstration;Handout   Comprehension Verbalized understanding;Returned demonstration              Plan - 08/09/14 1459    Clinical Impression Statement Pt reports pain did not increase significantly with putty HEP. Fluidtherapy x 10 mins to decrease pain/ stiffness no adverse reactions. Graded clothespins for increased sustained pinch. Gripper set at 20 lbs for 1/2 the blocks for increased grip strength. Ultrasound 3MHz, 0.8 w/cm2, 20 % x 8 mins, to right ulnar wrist no adverse reactions. Therapist recommended pt. discuss with her son's HHPT, ways to perform behavior modification that will not  injure/ increase pain in her wrist. Therapist discussed importance of avoiding over strengthening, and modifiying activities to keep pain from increasing further.    Rehab Potential Good   Clinical Impairments Affecting Rehab Potential pain   OT Frequency 2x / week   OT Duration 8 weeks   OT Treatment/Interventions Self-care/ADL training;Moist Heat;Fluidtherapy;DME and/or AE instruction;Splinting;Patient/family education;Therapeutic exercises;Ultrasound;Therapeutic exercise;Therapeutic activities;Manual Therapy;Parrafin   Plan progess HEP as tolerated, modalities, gentle strengthening   OT Home Exercise Plan consider upgrading putty to red in last 2 visits, if no significant increase in pain.   Consulted and Agree with Plan of Care Patient        Problem List Patient Active Problem List   Diagnosis Date Noted  . Sinus tachycardia 06/13/2014  . Heart palpitations 02/13/2014  . Hypertension   . Migraine   . 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 Collapsed arches 01/18/2014                                              Cynthia Carey 08/09/2014, 3:23 PM

## 2014-08-14 ENCOUNTER — Ambulatory Visit: Payer: No Typology Code available for payment source | Admitting: Occupational Therapy

## 2014-08-14 DIAGNOSIS — M25531 Pain in right wrist: Secondary | ICD-10-CM | POA: Diagnosis not present

## 2014-08-14 DIAGNOSIS — M6281 Muscle weakness (generalized): Secondary | ICD-10-CM

## 2014-08-14 NOTE — Therapy (Signed)
Occupational Therapy Treatment  Patient Details  Name: Cynthia Carey MRN: [id number] Date of Birth: 1981/02/02  Encounter Date: 08/14/2014      OT End of Session - 08/14/14 0853    Visit Number 15   Number of Visits 17   Date for OT Re-Evaluation 08/21/14   OT Start Time 0800   OT Stop Time 0845   OT Time Calculation (min) 45 min   Activity Tolerance Patient tolerated treatment well      Past Medical History  Diagnosis Date  . Hypertension     during pregnancy  . Migraine     No past surgical history on file.  There were no vitals taken for this visit.  Visit Diagnosis:  Right wrist pain  Hand muscle weakness      Subjective Assessment - 08/14/14 0806    Symptoms The pain has almost subsided, now it's more stiffness   Pain Score 2    Pain Location Hand   Pain Orientation Right   Pain Type Chronic pain   Pain Onset More than a month ago   Pain Frequency Intermittent   Aggravating Factors  overuse   Pain Relieving Factors splint, rest   Effect of Pain on Daily Activities limitations on childcare   Multiple Pain Sites No            OT Treatments/Exercises (OP) - 08/14/14 0807    Exercises   Exercises Hand;Wrist   Hand Exercises   Theraputty - Grip x 15 reps (yellow resistance)   Theraputty - Pinch x 10 reps each finger   Other Hand Exercises wrist flexion and extension x 10 reps, 2 sets w/ 1 lb. weight   Modalities   Modalities Fluidotherapy;Ultrasound   Ultrasound   Ultrasound Location ulnar wrist   Ultrasound Parameters 3 mHz, 20% pulsed, 0.8 wts/cm2   Ultrasound Goals Pain   RUE Fluidotherapy   Number Minutes Fluidotherapy 10 Minutes   RUE Fluidotherapy Location Hand;Wrist   Comments to decrease pain/stiffness          OT Education - 08/14/14 0852    Education provided Yes   Education Details Reviewed putty ex's, instructed pt in how to progress strengthening    Person(s) Educated Patient   Methods Explanation   Comprehension  Verbalized understanding;Returned demonstration              Plan - 08/14/14 0854    Clinical Impression Statement Pt tolerating increased strengthening with no reports of increase in pain.    Rehab Potential Fair   Clinical Impairments Affecting Rehab Potential pain   Plan issue red putty, assess all goals and d/c O.T.        Problem List Patient Active Problem List   Diagnosis Date Noted  . Sinus tachycardia 06/13/2014  . Heart palpitations 02/13/2014  . Hypertension   . Migraine   . 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 Collapsed arches 01/18/2014                                              Jene EveryBallie, [employer] Hands Johnson,OT 08/14/2014, 8:57 AM

## 2014-08-21 ENCOUNTER — Ambulatory Visit: Payer: No Typology Code available for payment source | Admitting: Occupational Therapy

## 2014-08-21 DIAGNOSIS — M25531 Pain in right wrist: Secondary | ICD-10-CM

## 2014-08-21 DIAGNOSIS — M6281 Muscle weakness (generalized): Secondary | ICD-10-CM

## 2014-08-21 NOTE — Patient Instructions (Signed)
Continue with putty exercises squeezing putty 20 reps 1-2 x day Roll putty out, pinch with each finger and your thumb 20 reps 1-2 x day Pull putty with both hands like you are pulling taffy, 20 reps 1-2 x day  If you are having no increase in pain, you may perform exercises up to 3x day  If your pain increases: use ice or heat for 10 mins, monitoring skin closely. Rest wrist and wear wrist brace.  Discontinue strengthening until pain decreases.

## 2014-08-21 NOTE — Therapy (Signed)
Occupational Therapy Treatment  Patient Details  Name: Cynthia Carey MRN: [id number] Date of Birth: 1981/03/07  Encounter Date: 08/21/2014      OT End of Session - 08/21/14 1628    Visit Number 16   Number of Visits 16   OT Start Time 1522   OT Stop Time 1605   OT Time Calculation (min) 43 min   Activity Tolerance Patient tolerated treatment well   Behavior During Therapy [facility] for tasks assessed/performed      Past Medical History  Diagnosis Date  . Hypertension     during pregnancy  . Migraine     No past surgical history on file.  There were no vitals taken for this visit.  Visit Diagnosis:  Right wrist pain  Hand muscle weakness      Subjective Assessment - 08/21/14 1550    Symptoms Pt reports pain 2-3/10 in RUE   Currently in Pain? Yes   Pain Score 3    Pain Location Wrist   Pain Orientation Right   Pain Descriptors / Indicators Aching   Pain Type Chronic pain   Pain Onset More than a month ago   Pain Frequency Intermittent   Aggravating Factors  overuse, heavy lifting   Pain Relieving Factors splint, rest   Effect of Pain on Daily Activities limits lifting her son or heavier items   Multiple Pain Sites No              OT Education - 08/21/14 1616    Education provided Yes   Education Details Upgraded to red theraputty HEP. discussed proper positioning during wrist flexion/ extension strengthening   Person(s) Educated Patient   Methods Explanation   Comprehension Verbalized understanding;Returned demonstration            OT Long Term Goals - 08/21/14 1530    OT LONG TERM GOAL #1   Title I with updated HEP   Status Achieved   OT LONG TERM GOAL #2   Title Pt will report that she has resumed use of RUE as dominant hand with pain less than or equal to 3/10.   Status Achieved   OT LONG TERM GOAL #3   Title Pt will demonstrate grip strength of 40lbs or greater in RUE for increased functional use.   Baseline RUE grip 40 lbs   Status  Achieved          Plan - 08/21/14 1620    Clinical Impression Statement Fluidotherapy to RUE x 10 mins, due to stiffnesss and pain, no adverse reactions. Ultrasound to right wrist 3 MHz, 0.8wcm2, 20 % x 8 mins , no adverse reactions. Pt. reported a reduction in pain to 1-2 /10. Upgraded HEP to red putty, pt returned demonstration. wrist strengthening with 1 lb, then 2 lb weight 20 reps each direction. Therapist discussed proper positioning to minimize pain and activity modification to minimize pain. Pt agrees with d/c reporting a significant reduction in pain.   Rehab Potential Good   OT Frequency 2x / week   OT Duration 8 weeks   OT Treatment/Interventions Self-care/ADL training;Moist Heat;Fluidtherapy;DME and/or AE instruction;Splinting;Patient/family education;Therapeutic exercises;Ultrasound;Therapeutic exercise;Therapeutic activities;Manual Therapy;Parrafin   Plan discharge O.T. , pt to progress to HEP.   OT Home Exercise Plan red putty, light wrist strengthening   Consulted and Agree with Plan of Care Patient        Problem List Patient Active Problem List   Diagnosis Date Noted  . Sinus tachycardia 06/13/2014  . Heart palpitations 02/13/2014  .  Hypertension   . Migraine   . 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 Collapsed arches 01/18/2014                         OCCUPATIONAL THERAPY DISCHARGE [facility]  Current functional level related to goals / functional outcomes: Pt met all long term goals. She reports a significant decrease in overall pain and she will now transition to HEP.   Remaining deficits: Pain,right wrist 1-3/10, heat alleviates, overuse aggravates.  mildly decreased strength   Education / Equipment: Pt was educated regarding the following: activity modification, HEP, and splint wear / care. Pt verbalized understanding of all education.  Plan: Patient agrees to discharge.  Patient goals were not met. Patient is being discharged  due to meeting the stated rehab goals.  ?????                        Theone Murdoch, OTR/L [phone] Phone: [phone] 4:29 PM 08/21/2014 RINE,KATHRYN 08/21/2014, 4:29 PM

## 2014-10-11 ENCOUNTER — Ambulatory Visit: Payer: 59 | Admitting: Cardiology

## 2014-10-31 ENCOUNTER — Encounter: Payer: Self-pay | Admitting: Cardiology
